# Patient Record
Sex: Male | Born: 1945 | Race: White | Hispanic: No | Marital: Married | Smoking: Former smoker
Health system: Southern US, Community
[De-identification: ages and names within clinical notes are randomized; demographics above are authoritative.]

## PROBLEM LIST (undated history)

## (undated) DIAGNOSIS — E213 Hyperparathyroidism, unspecified: Secondary | ICD-10-CM

## (undated) DIAGNOSIS — E7800 Pure hypercholesterolemia, unspecified: Secondary | ICD-10-CM

## (undated) DIAGNOSIS — F329 Major depressive disorder, single episode, unspecified: Secondary | ICD-10-CM

## (undated) DIAGNOSIS — F32A Depression, unspecified: Secondary | ICD-10-CM

## (undated) DIAGNOSIS — N400 Enlarged prostate without lower urinary tract symptoms: Secondary | ICD-10-CM

## (undated) DIAGNOSIS — I10 Essential (primary) hypertension: Secondary | ICD-10-CM

## (undated) DIAGNOSIS — E119 Type 2 diabetes mellitus without complications: Secondary | ICD-10-CM

## (undated) DIAGNOSIS — Z87898 Personal history of other specified conditions: Secondary | ICD-10-CM

## (undated) DIAGNOSIS — L570 Actinic keratosis: Secondary | ICD-10-CM

## (undated) DIAGNOSIS — R3911 Hesitancy of micturition: Secondary | ICD-10-CM

## (undated) DIAGNOSIS — F419 Anxiety disorder, unspecified: Secondary | ICD-10-CM

## (undated) DIAGNOSIS — D1730 Benign lipomatous neoplasm of skin and subcutaneous tissue of unspecified sites: Secondary | ICD-10-CM

## (undated) DIAGNOSIS — R351 Nocturia: Secondary | ICD-10-CM

## (undated) DIAGNOSIS — K432 Incisional hernia without obstruction or gangrene: Secondary | ICD-10-CM

## (undated) DIAGNOSIS — E039 Hypothyroidism, unspecified: Secondary | ICD-10-CM

## (undated) DIAGNOSIS — M7712 Lateral epicondylitis, left elbow: Secondary | ICD-10-CM

## (undated) DIAGNOSIS — K219 Gastro-esophageal reflux disease without esophagitis: Secondary | ICD-10-CM

## (undated) DIAGNOSIS — G039 Meningitis, unspecified: Secondary | ICD-10-CM

## (undated) DIAGNOSIS — L821 Other seborrheic keratosis: Secondary | ICD-10-CM

## (undated) DIAGNOSIS — Z973 Presence of spectacles and contact lenses: Secondary | ICD-10-CM

## (undated) HISTORY — PX: APPENDECTOMY: SHX54

## (undated) HISTORY — PX: VASECTOMY: SHX75

## (undated) HISTORY — PX: FOOT SURGERY: SHX648

---

## 2002-08-23 ENCOUNTER — Ambulatory Visit (HOSPITAL_COMMUNITY): Admission: RE | Admit: 2002-08-23 | Discharge: 2002-08-23 | Payer: Self-pay | Admitting: Gastroenterology

## 2014-09-24 ENCOUNTER — Encounter: Payer: Self-pay | Admitting: Endocrinology

## 2014-09-24 ENCOUNTER — Ambulatory Visit (INDEPENDENT_AMBULATORY_CARE_PROVIDER_SITE_OTHER): Payer: Medicare Other | Admitting: Endocrinology

## 2014-09-24 MED ORDER — FUROSEMIDE 20 MG PO TABS: 20.0000 mg | ORAL_TABLET | Freq: Every day | ORAL | Status: DC

## 2014-09-24 NOTE — Progress Notes (Signed)
Subjective:    Patient ID: Douglas Hutchinson, male    DOB: 1946/01/16, 69 y.o.   MRN: [id number]  HPI Pt was first noted to have hypercalcemia in mid-2015.  No assoc urolithiasis.  He has never had PUD, pancreatitis, depression, Li++, cancer, recent severe injury, prolonged immobilization, antacids, sarcoidosis, or hyperparathyroidism.  He had a severe fracture of the right ankle in 1999, when he fell from a ladder.  No other fracture hx.  He does not take vitamin-D or A supplements.   No past medical history on file.  No past surgical history on file.  Social History   Social History  . Marital Status: Married    Spouse Name: N/A  . Number of Children: N/A  . Years of Education: N/A   Occupational History  . Not on file.   Social History Main Topics  . Smoking status: Former Research scientist (life sciences)  . Smokeless tobacco: Not on file  . Alcohol Use: 0.0 oz/week    0 Standard drinks or equivalent per week  . Drug Use: Not on file  . Sexual Activity: Not on file   Other Topics Concern  . Not on file   Social History Narrative  . No narrative on file    No current outpatient prescriptions on file prior to visit.   No current facility-administered medications on file prior to visit.    Allergies  Allergen Reactions  . Buspirone   . Itraconazole   . Penicillins     Family History  Problem Relation Age of Onset  . Hypercalcemia Neg Hx     BP 118/87 mmHg  Pulse 68  Temp(Src) 97.4 F (36.3 C) (Oral)  Ht 5\' 10"  (1.778 m)  Wt 174 lb (78.926 kg)  BMI 24.97 kg/m2  SpO2 99%  Review of Systems denies weight loss, galactorrhea, hematuria, memory loss, erectile dysfunction, numbness, arthralgias, abdominal pain, muscle weakness, urinary frequency, hypoglycemia, skin rash, visual loss, sob, diarrhea, rhinorrhea, easy bruising, and depression    Objective:   Physical Exam VS: see vs page GEN: no distress HEAD: head: no deformity eyes: no periorbital swelling, no proptosis external  nose and ears are normal mouth: no lesion seen NECK: supple, thyroid is not enlarged CHEST WALL: no deformity.  No kyphosis. LUNGS: clear to auscultation BREASTS:  No gynecomastia CV: reg rate and rhythm, no murmur ABD: abdomen is soft, nontender.  no hepatosplenomegaly.  not distended.  no hernia MUSCULOSKELETAL: muscle bulk and strength are grossly normal.  no obvious joint swelling.  gait is normal and steady EXTEMITIES: no deformity.  no ulcer on the feet.  feet are of normal color and temp.  no edema PULSES: dorsalis pedis intact bilat.  no carotid bruit NEURO:  cn 2-12 grossly intact.   readily moves all 4's.  sensation is intact to touch on the feet SKIN:  Normal texture and temperature.  No rash or suspicious lesion is visible.   NODES:  None palpable at the neck PSYCH: alert, well-oriented.  Does not appear anxious nor depressed.  I have reviewed outside records: Pt is on HCTZ, and HTN was well-controlled: he was noted to have hypercalcemia and elevated PTH, and referred here.  outside test results are reviewed: PTH=142.4 Ca++=11.3    Assessment & Plan:  Hyperparathyroidism, probably primary, but we should exclude vit-D deficiency Hypercalcemia, prob due to hyperparathyroidism, but we should exclude HCTZ.   HTN: given this BP, he can tolerate changing HCTZ to lasix.    Patient is advised the following:  Patient Instructions  Here is a paper to get the vitamin-D checked at [facility].   i have sent a prescription to your pharmacy, to change the HCTZ to a different fluid pill.  Please come back for a follow-up appointment in 1 month.

## 2014-09-24 NOTE — Patient Instructions (Addendum)
Here is a paper to get the vitamin-D checked at [facility].   i have sent a prescription to your pharmacy, to change the HCTZ to a different fluid pill.  Please come back for a follow-up appointment in 1 month.

## 2014-09-26 ENCOUNTER — Telehealth: Payer: Self-pay | Admitting: Endocrinology

## 2014-09-26 NOTE — Telephone Encounter (Signed)
Left voicemail advising of note below. Requested call back if the pt would like to discuss.  

## 2014-09-26 NOTE — Telephone Encounter (Signed)
please call patient: Vitamin-D is normal. Please adjust meds as we discussed. i'll see you next time.

## 2014-10-27 ENCOUNTER — Ambulatory Visit (INDEPENDENT_AMBULATORY_CARE_PROVIDER_SITE_OTHER): Payer: Medicare Other | Admitting: Endocrinology

## 2014-10-27 ENCOUNTER — Encounter: Payer: Self-pay | Admitting: Endocrinology

## 2014-10-27 ENCOUNTER — Telehealth: Payer: Self-pay

## 2014-10-27 DIAGNOSIS — F32A Depression, unspecified: Secondary | ICD-10-CM

## 2014-10-27 DIAGNOSIS — E210 Primary hyperparathyroidism: Secondary | ICD-10-CM | POA: Diagnosis not present

## 2014-10-27 DIAGNOSIS — F329 Major depressive disorder, single episode, unspecified: Secondary | ICD-10-CM | POA: Diagnosis not present

## 2014-10-27 DIAGNOSIS — E039 Hypothyroidism, unspecified: Secondary | ICD-10-CM

## 2014-10-27 MED ORDER — FUROSEMIDE 20 MG PO TABS: 10.0000 mg | ORAL_TABLET | Freq: Every day | ORAL | Status: DC

## 2014-10-27 NOTE — Telephone Encounter (Signed)
done

## 2014-10-27 NOTE — Telephone Encounter (Signed)
I contacted the pt and advised his bone density scan is scheduled for 11/13/14 at [facility] on [address]. Appointment time is 9:30. Pt was provided address and phone number. Pt voiced understanding.

## 2014-10-27 NOTE — Patient Instructions (Addendum)
Please reduce the furosemide to 1/2 pill daily.  blood tests are requested for you today.  We'll let you know about the results.  Please come back for a follow-up appointment in 6 months.  Let's check the bone density test.

## 2014-10-27 NOTE — Telephone Encounter (Signed)
I contacted [facility]. They do not do bone density scans at their office.

## 2014-10-27 NOTE — Telephone Encounter (Signed)
Ok, can we put the order in? Thanks!

## 2014-10-27 NOTE — Telephone Encounter (Signed)
Ok, please request for [facility]

## 2014-10-27 NOTE — Progress Notes (Signed)
   Subjective:    Patient ID: Douglas Hutchinson, male    DOB: 11/01/1945, 69 y.o.   MRN: [id number]  HPI Pt returns for f/u of primary hyperparathyroidism (hypercalcemia was first noted in mid-2015; only fx was of the right ankle in 1999, when he fell from a ladder; HCTZ was changed to lasix last month; Vit-D was normal).  pt says he has polyuria, but no pain at the prostate.  No assoc dysuria.   No past medical history on file.  No past surgical history on file.  Social History   Social History  . Marital Status: Married    Spouse Name: N/A  . Number of Children: N/A  . Years of Education: N/A   Occupational History  . Not on file.   Social History Main Topics  . Smoking status: Former Research scientist (life sciences)  . Smokeless tobacco: Not on file  . Alcohol Use: 0.0 oz/week    0 Standard drinks or equivalent per week  . Drug Use: Not on file  . Sexual Activity: Not on file   Other Topics Concern  . Not on file   Social History Narrative    Current Outpatient Prescriptions on File Prior to Visit  Medication Sig Dispense Refill  . aspirin 325 MG tablet Take by mouth.    Marland Kitchen atorvastatin (LIPITOR) 40 MG tablet TK 1 T PO QD  1  . FLUoxetine (PROZAC) 10 MG capsule Take by mouth.    . levothyroxine (SYNTHROID, LEVOTHROID) 75 MCG tablet TK 1 T PO QD  2  . metFORMIN (GLUCOPHAGE) 1000 MG tablet TK 1 T PO BID WITH MEALS  0  . Multiple Vitamin (MULTIVITAMIN) capsule Take 1 capsule by mouth daily.     No current facility-administered medications on file prior to visit.    Allergies  Allergen Reactions  . Buspirone   . Itraconazole   . Penicillins     Family History  Problem Relation Age of Onset  . Hypercalcemia Neg Hx     BP 124/82 mmHg  Pulse 68  Temp(Src) 97.7 F (36.5 C) (Oral)  Ht 5\' 10"  (1.778 m)  Wt 175 lb (79.379 kg)  BMI 25.11 kg/m2  SpO2 97%    Review of Systems Denies edema    Objective:   Physical Exam VITAL SIGNS:  See vs page GENERAL: no distress Ext: no  edema    PTH=155 Ca++=11.2    Assessment & Plan:  HTN: well-controlled Polyuria, new, due to lasix.  Primary hyperparathyroidism, persistent.   No need for surgery now unless she has osteoporosis.     Patient is advised the following: Patient Instructions  Please reduce the furosemide to 1/2 pill daily.  blood tests are requested for you today.  We'll let you know about the results.  Please come back for a follow-up appointment in 6 months.  Let's check the bone density test.

## 2014-10-28 DIAGNOSIS — E039 Hypothyroidism, unspecified: Secondary | ICD-10-CM | POA: Insufficient documentation

## 2014-10-28 DIAGNOSIS — F329 Major depressive disorder, single episode, unspecified: Secondary | ICD-10-CM | POA: Insufficient documentation

## 2014-10-28 DIAGNOSIS — F32A Depression, unspecified: Secondary | ICD-10-CM | POA: Insufficient documentation

## 2014-10-28 LAB — PTH, INTACT AND CALCIUM
Calcium: 11.2 mg/dL — ABNORMAL HIGH (ref 8.4–10.5)
PTH: 155 pg/mL — ABNORMAL HIGH (ref 14–64)

## 2014-11-13 ENCOUNTER — Ambulatory Visit (INDEPENDENT_AMBULATORY_CARE_PROVIDER_SITE_OTHER)
Admission: RE | Admit: 2014-11-13 | Discharge: 2014-11-13 | Disposition: A | Discharge status: Home / Self Care | Payer: Medicare Other | Source: Ambulatory Visit | Attending: Endocrinology | Admitting: Endocrinology

## 2014-11-13 DIAGNOSIS — E210 Primary hyperparathyroidism: Secondary | ICD-10-CM

## 2014-11-18 ENCOUNTER — Telehealth: Payer: Self-pay | Admitting: Endocrinology

## 2014-11-18 DIAGNOSIS — E210 Primary hyperparathyroidism: Secondary | ICD-10-CM

## 2014-11-18 NOTE — Telephone Encounter (Signed)
Ok.  you will receive a phone call, about a day and time for an appointment 

## 2014-11-18 NOTE — Telephone Encounter (Signed)
Patient has decided that he does want to have a surgery consult. Please enter general surgery referral. Thank you!

## 2014-11-28 ENCOUNTER — Ambulatory Visit: Payer: Self-pay | Admitting: General Surgery

## 2014-11-28 NOTE — H&P (Signed)
History of Present Illness Douglas Spruce MD; 11/28/2014 11:13 AM) Patient words: New-hyperparathyroid.  The patient is a 69 year old male who presents with a parathyroid neoplasm. 69 year old male found to have calcium of 11.2 on screening exam by primary care physician and was referred to endocrinologist who performed DEXA scan finding osteoporosis and bilateral hips. He has no history of nephrolithiasis, no history of GI constipation or other complaints, no history of depression and anxiety, he had a ankle fracture years ago from falling off a ladder. He has no history of myocardial infarction he is on metformin for prediabetes and has taken aspirin for several years. He does not smoke.  Referred by Dr. Loanne Drilling   Other Problems Malachi Bonds, CMA; 11/28/2014 10:51 AM) Anxiety Disorder Asthma Enlarged Prostate Hypercholesterolemia Other disease, cancer, significant illness  Past Surgical History Malachi Bonds, CMA; 11/28/2014 10:51 AM) Appendectomy Colon Polyp Removal - Colonoscopy Foot Surgery Right. Vasectomy  Diagnostic Studies History Malachi Bonds, CMA; 11/28/2014 10:51 AM) Colonoscopy 1-5 years ago  Allergies Malachi Bonds, CMA; 11/28/2014 10:52 AM) BusPIRone HCl *ANTIANXIETY AGENTS* Itraconazole *ANTIFUNGALS* Penicillin G Sodium *PENICILLINS*  Medication History (Chemira Jones, CMA; 11/28/2014 10:53 AM) Atorvastatin Calcium (40MG  Tablet, Oral) Active. Furosemide (20MG  Tablet, Oral) Active. Levothyroxine Sodium (75MCG Tablet, Oral) Active. MetFORMIN HCl (1000MG  Tablet, Oral) Active. Aspirin (81MG  Tablet, Oral) Active. FLUoxetine HCl (PMDD) (10MG  Tablet, Oral) Active. Multivitamin Adult (Oral) Active. Medications Reconciled  Social History Malachi Bonds, CMA; 11/28/2014 10:51 AM) Alcohol use Occasional alcohol use. Caffeine use Carbonated beverages, Coffee, Tea. No drug use Tobacco use Former smoker.  Family History Malachi Bonds, CMA; 11/28/2014 10:51 AM) Prostate Cancer Father. Thyroid problems Mother.    Review of Systems Malachi Bonds CMA; 11/28/2014 10:51 AM) General Not Present- Appetite Loss, Chills, Fatigue, Fever, Night Sweats, Weight Gain and Weight Loss. Skin Not Present- Change in Wart/Mole, Dryness, Hives, Jaundice, New Lesions, Non-Healing Wounds, Rash and Ulcer. HEENT Present- Wears glasses/contact lenses. Not Present- Earache, Hearing Loss, Hoarseness, Nose Bleed, Oral Ulcers, Ringing in the Ears, Seasonal Allergies, Sinus Pain, Sore Throat, Visual Disturbances and Yellow Eyes. Respiratory Present- Snoring. Not Present- Bloody sputum, Chronic Cough, Difficulty Breathing and Wheezing. Cardiovascular Not Present- Chest Pain, Difficulty Breathing Lying Down, Leg Cramps, Palpitations, Rapid Heart Rate, Shortness of Breath and Swelling of Extremities. Gastrointestinal Not Present- Abdominal Pain, Bloating, Bloody Stool, Change in Bowel Habits, Chronic diarrhea, Constipation, Difficulty Swallowing, Excessive gas, Gets full quickly at meals, Hemorrhoids, Indigestion, Nausea, Rectal Pain and Vomiting. Male Genitourinary Present- Change in Urinary Stream, Frequency, Nocturia and Urgency. Not Present- Blood in Urine, Impotence, Painful Urination and Urine Leakage. Musculoskeletal Not Present- Back Pain, Joint Pain, Joint Stiffness, Muscle Pain, Muscle Weakness and Swelling of Extremities. Neurological Not Present- Decreased Memory, Fainting, Headaches, Numbness, Seizures, Tingling, Tremor, Trouble walking and Weakness. Psychiatric Present- Anxiety. Not Present- Bipolar, Change in Sleep Pattern, Depression, Fearful and Frequent crying. Endocrine Not Present- Cold Intolerance, Excessive Hunger, Hair Changes, Heat Intolerance, Hot flashes and New Diabetes. Hematology Not Present- Easy Bruising, Excessive bleeding, Gland problems, HIV and Persistent Infections.  Vitals (Chemira Jones CMA; 11/28/2014 10:51  AM) 11/28/2014 10:51 AM Weight: 175.8 lb Height: 70in Body Surface Area: 1.98 m Body Mass Index: 25.22 kg/m  Temp.: 97.30F(Oral)  Pulse: 78 (Regular)  BP: 132/84 (Sitting, Left Arm, Standard)       Physical Exam Douglas Spruce MD; 11/28/2014 11:08 AM) General Mental Status-Alert. General Appearance-Cooperative. Orientation-Oriented X4. Posture-Normal posture.  Integumentary Global Assessment Upon inspection and palpation of skin surfaces of the -  Head/Face: no rashes, ulcers, lesions or evidence of photo damage. No palpable nodules or masses and Neck: no visible lesions or palpable masses.  Head and Neck Head-normocephalic, atraumatic with no lesions or palpable masses. Face Global Assessment - atraumatic. Neck -Note: No carotid bruits, no palpable masses in the neck or supraclavicular stasis.  Thyroid Gland Characteristics - normal size and consistency.  Eye Eyeball - Bilateral-Extraocular movements intact. Sclera/Conjunctiva - Bilateral-No scleral icterus, No Discharge.  ENMT Nose and Sinuses External Inspection of the Nose - no deformities observed, no swelling present.  Chest and Lung Exam Palpation Palpation of the chest reveals - Non-tender. Auscultation Breath sounds - Normal.  Cardiovascular Auscultation Rhythm - Regular. Heart Sounds - S1 WNL and S2 WNL. Carotid arteries - No Carotid bruit.  Abdomen Inspection Inspection of the abdomen reveals - No Visible peristalsis, No Abnormal pulsations and No Paradoxical movements. Palpation/Percussion Palpation and Percussion of the abdomen reveal - Soft, Non Tender, No Rebound tenderness, No Rigidity (guarding), No hepatosplenomegaly and No Palpable abdominal masses.  Peripheral Vascular Upper Extremity Palpation - Pulses bilaterally normal. Lower Extremity Palpation - Edema - Bilateral - No edema.  Neurologic Neurologic evaluation reveals -normal sensation and  normal coordination.  Neuropsychiatric Mental status exam performed with findings of-able to articulate well with normal speech/language, rate, volume and coherence and thought content normal with ability to perform basic computations and apply abstract reasoning.  Musculoskeletal Normal Exam - Bilateral-Upper Extremity Strength Normal and Lower Extremity Strength Normal.    Assessment & Plan Douglas Spruce MD; 11/28/2014 11:12 AM) HYPERPARATHYROIDISM (E21.3) Impression: 69 year old male with hypercalcemia and hyperparathyroidism primary. We discussed the etiology of his pathology that it could be from a single adenoma or from diffuse hyper parathyroidism. We discussed the options for surgery versus imaging and surgery versus observation. We decided to proceed with localization imaging and then perform exploration of parathyroids. We discussed the risks of the operation including but not limited to recurrent laryngeal nerve injury, vagus nerve injury, airway edema, bleeding, infection, hypocalcemia, failure to achieve a regular PTH, possibility of abnormal parathyroid tissue in places such as chest or posterior neck. We discussed the reason to operate is to avoid other complications of hypercalcemia and to improve his osteoporosis. He showed good understanding and decided to proceed. -parathyroid scan -plan for neck exploration with parathyroidectomy Current Plans PARATHYROID SCAN (16109) You are being scheduled for surgery - Our schedulers will call you.  You should hear from our office's scheduling department within 5 working days about the location, date, and time of surgery. We try to make accommodations for patient's preferences in scheduling surgery, but sometimes the OR schedule or the surgeon's schedule prevents [place] from making those accommodations.  If you have not heard from our office [phone]) in 5 working days, call the office and ask for your surgeon's nurse.  If you  have other questions about your diagnosis, plan, or surgery, call the office and ask for your surgeon's nurse.

## 2014-12-05 ENCOUNTER — Other Ambulatory Visit (HOSPITAL_COMMUNITY): Payer: Self-pay | Admitting: General Surgery

## 2014-12-08 ENCOUNTER — Other Ambulatory Visit (HOSPITAL_COMMUNITY): Payer: Self-pay | Admitting: General Surgery

## 2014-12-08 DIAGNOSIS — E213 Hyperparathyroidism, unspecified: Secondary | ICD-10-CM

## 2014-12-16 ENCOUNTER — Encounter (HOSPITAL_COMMUNITY): Payer: Medicare Other

## 2014-12-19 ENCOUNTER — Encounter (HOSPITAL_COMMUNITY)
Admission: RE | Admit: 2014-12-19 | Discharge: 2014-12-19 | Disposition: A | Discharge status: Home / Self Care | Payer: Medicare Other | Source: Ambulatory Visit | Attending: General Surgery | Admitting: General Surgery

## 2014-12-19 DIAGNOSIS — E213 Hyperparathyroidism, unspecified: Secondary | ICD-10-CM | POA: Insufficient documentation

## 2014-12-19 MED ORDER — TECHNETIUM TC 99M SESTAMIBI GENERIC - CARDIOLITE
25.7000 | Freq: Once | INTRAVENOUS | Status: AC | PRN
  Administered 2014-12-19: 26 via INTRAVENOUS

## 2015-01-28 ENCOUNTER — Other Ambulatory Visit: Payer: Self-pay | Admitting: General Surgery

## 2015-01-29 ENCOUNTER — Encounter (HOSPITAL_COMMUNITY): Payer: Self-pay

## 2015-01-29 ENCOUNTER — Encounter (HOSPITAL_COMMUNITY)
Admission: RE | Admit: 2015-01-29 | Discharge: 2015-01-29 | Disposition: A | Discharge status: Home / Self Care | Payer: Medicare Other | Source: Ambulatory Visit | Attending: General Surgery | Admitting: General Surgery

## 2015-01-29 ENCOUNTER — Encounter (INDEPENDENT_AMBULATORY_CARE_PROVIDER_SITE_OTHER): Payer: Self-pay

## 2015-01-29 DIAGNOSIS — E210 Primary hyperparathyroidism: Secondary | ICD-10-CM | POA: Diagnosis not present

## 2015-01-29 DIAGNOSIS — Z01818 Encounter for other preprocedural examination: Secondary | ICD-10-CM | POA: Insufficient documentation

## 2015-01-29 HISTORY — DX: Pure hypercholesterolemia, unspecified: E78.00

## 2015-01-29 HISTORY — DX: Hyperparathyroidism, unspecified: E21.3

## 2015-01-29 HISTORY — DX: Nocturia: R35.1

## 2015-01-29 HISTORY — DX: Hypercalcemia: E83.52

## 2015-01-29 HISTORY — DX: Hesitancy of micturition: R39.11

## 2015-01-29 HISTORY — DX: Hypothyroidism, unspecified: E03.9

## 2015-01-29 HISTORY — DX: Lateral epicondylitis, left elbow: M77.12

## 2015-01-29 HISTORY — DX: Actinic keratosis: L57.0

## 2015-01-29 HISTORY — DX: Meningitis, unspecified: G03.9

## 2015-01-29 HISTORY — DX: Presence of spectacles and contact lenses: Z97.3

## 2015-01-29 HISTORY — DX: Other seborrheic keratosis: L82.1

## 2015-01-29 HISTORY — DX: Anxiety disorder, unspecified: F41.9

## 2015-01-29 HISTORY — DX: Benign prostatic hyperplasia without lower urinary tract symptoms: N40.0

## 2015-01-29 HISTORY — DX: Incisional hernia without obstruction or gangrene: K43.2

## 2015-01-29 HISTORY — DX: Benign lipomatous neoplasm of skin and subcutaneous tissue of unspecified sites: D17.30

## 2015-01-29 HISTORY — DX: Type 2 diabetes mellitus without complications: E11.9

## 2015-01-29 LAB — CBC
HEMATOCRIT: 44 % (ref 39.0–52.0)
Hemoglobin: 14.3 g/dL (ref 13.0–17.0)
MCH: 29.5 pg (ref 26.0–34.0)
MCHC: 32.5 g/dL (ref 30.0–36.0)
MCV: 90.7 fL (ref 78.0–100.0)
PLATELETS: 167 10*3/uL (ref 150–400)
RBC: 4.85 MIL/uL (ref 4.22–5.81)
RDW: 13 % (ref 11.5–15.5)
WBC: 5.6 10*3/uL (ref 4.0–10.5)

## 2015-01-29 LAB — BASIC METABOLIC PANEL
Anion gap: 7 (ref 5–15)
BUN: 18 mg/dL (ref 6–20)
CHLORIDE: 106 mmol/L (ref 101–111)
CO2: 29 mmol/L (ref 22–32)
CREATININE: 1.07 mg/dL (ref 0.61–1.24)
Calcium: 10.6 mg/dL — ABNORMAL HIGH (ref 8.9–10.3)
GFR calc Af Amer: >60 mL/min (ref >60)
GFR calc non Af Amer: >60 mL/min (ref >60)
GLUCOSE: 115 mg/dL — AB (ref 65–99)
POTASSIUM: 4.7 mmol/L (ref 3.5–5.1)
Sodium: 142 mmol/L (ref 135–145)

## 2015-01-29 NOTE — Progress Notes (Signed)
H&P per chart per Dr Tollie Pizza 09/30/2014

## 2015-01-29 NOTE — Patient Instructions (Signed)
Douglas Hutchinson  01/29/2015   Your procedure is scheduled on: Tuesday February 10, 2015  [facility]  Entrance take [place]  elevators to 3rd floor to  [place] at 9:30 AM.  Call this number if you have problems the morning of surgery [phone]   Remember: ONLY 1 PERSON MAY GO WITH YOU TO SHORT STAY TO GET  READY MORNING OF [place].  Do not eat food or drink liquids :After Midnight.     Take these medicines the morning of surgery with A SIP OF WATER: Fluoxetine (Prozac); Levothyroxine DO NOT TAKE ANY DIABETIC MEDICATIONS DAY OF YOUR SURGERY                               You may not have any metal on your body including hair pins and              piercings  Do not wear jewelry, lotions, powders or colognes, deodorant                          Men may shave face and neck.   Do not bring valuables to the hospital. [name].  Contacts, dentures or bridgework may not be worn into surgery.  Leave suitcase in the car. After surgery it may be brought to your room.   _____________________________________________________________________             [facility] - Preparing for Surgery Before surgery, you can play an important role.  Because skin is not sterile, your skin needs to be as free of germs as possible.  You can reduce the number of germs on your skin by washing with CHG (chlorahexidine gluconate) soap before surgery.  CHG is an antiseptic cleaner which kills germs and bonds with the skin to continue killing germs even after washing. Please DO NOT use if you have an allergy to CHG or antibacterial soaps.  If your skin becomes reddened/irritated stop using the CHG and inform your nurse when you arrive at Short Stay. Do not shave (including legs and underarms) for at least 48 hours prior to the first CHG shower.  You may shave your face/neck. Please follow these instructions  carefully:  1.  Shower with CHG Soap the night before surgery and the  morning of Surgery.  2.  If you choose to wash your hair, wash your hair first as usual with your  normal  shampoo.  3.  After you shampoo, rinse your hair and body thoroughly to remove the  shampoo.                           4.  Use CHG as you would any other liquid soap.  You can apply chg directly  to the skin and wash                       Gently with a scrungie or clean washcloth.  5.  Apply the CHG Soap to your body ONLY FROM THE NECK DOWN.   Do not use on face/ open  Wound or open sores. Avoid contact with eyes, ears mouth and genitals (private parts).                       Wash face,  Genitals (private parts) with your normal soap.             6.  Wash thoroughly, paying special attention to the area where your surgery  will be performed.  7.  Thoroughly rinse your body with warm water from the neck down.  8.  DO NOT shower/wash with your normal soap after using and rinsing off  the CHG Soap.                9.  Pat yourself dry with a clean towel.            10.  Wear clean pajamas.            11.  Place clean sheets on your bed the night of your first shower and do not  sleep with pets. Day of Surgery : Do not apply any lotions/deodorants the morning of surgery.  Please wear clean clothes to the hospital/surgery center.  FAILURE TO FOLLOW THESE INSTRUCTIONS MAY RESULT IN THE CANCELLATION OF YOUR SURGERY PATIENT SIGNATURE_________________________________  NURSE SIGNATURE__________________________________  ________________________________________________________________________

## 2015-01-30 LAB — HEMOGLOBIN A1C
Hgb A1c MFr Bld: 5.9 % — ABNORMAL HIGH (ref 4.8–5.6)
MEAN PLASMA GLUCOSE: 123 mg/dL

## 2015-01-30 NOTE — Progress Notes (Signed)
HGA1C results in epic per PAT visit 01/29/2015 sent to Dr Kieth Brightly

## 2015-02-03 ENCOUNTER — Ambulatory Visit: Payer: Self-pay | Admitting: General Surgery

## 2015-02-10 ENCOUNTER — Encounter (HOSPITAL_COMMUNITY): Payer: Self-pay | Admitting: Certified Registered Nurse Anesthetist

## 2015-02-10 ENCOUNTER — Ambulatory Visit (HOSPITAL_COMMUNITY)
Admission: RE | Admit: 2015-02-10 | Discharge: 2015-02-10 | Disposition: A | Discharge status: Home / Self Care | Payer: Medicare Other | Source: Ambulatory Visit | Attending: General Surgery | Admitting: General Surgery

## 2015-02-10 ENCOUNTER — Encounter (HOSPITAL_COMMUNITY)
Admission: RE | Disposition: A | Discharge status: Home / Self Care | Payer: Self-pay | Source: Ambulatory Visit | Attending: General Surgery

## 2015-02-10 ENCOUNTER — Encounter (HOSPITAL_COMMUNITY): Payer: Self-pay | Admitting: *Deleted

## 2015-02-10 DIAGNOSIS — E119 Type 2 diabetes mellitus without complications: Secondary | ICD-10-CM | POA: Diagnosis not present

## 2015-02-10 DIAGNOSIS — Z79899 Other long term (current) drug therapy: Secondary | ICD-10-CM | POA: Diagnosis not present

## 2015-02-10 DIAGNOSIS — Z7982 Long term (current) use of aspirin: Secondary | ICD-10-CM | POA: Diagnosis not present

## 2015-02-10 DIAGNOSIS — Z539 Procedure and treatment not carried out, unspecified reason: Secondary | ICD-10-CM | POA: Diagnosis not present

## 2015-02-10 DIAGNOSIS — K219 Gastro-esophageal reflux disease without esophagitis: Secondary | ICD-10-CM | POA: Insufficient documentation

## 2015-02-10 DIAGNOSIS — N400 Enlarged prostate without lower urinary tract symptoms: Secondary | ICD-10-CM | POA: Diagnosis not present

## 2015-02-10 DIAGNOSIS — E039 Hypothyroidism, unspecified: Secondary | ICD-10-CM | POA: Insufficient documentation

## 2015-02-10 DIAGNOSIS — Z87891 Personal history of nicotine dependence: Secondary | ICD-10-CM | POA: Insufficient documentation

## 2015-02-10 DIAGNOSIS — E210 Primary hyperparathyroidism: Secondary | ICD-10-CM | POA: Diagnosis not present

## 2015-02-10 DIAGNOSIS — E213 Hyperparathyroidism, unspecified: Secondary | ICD-10-CM | POA: Diagnosis present

## 2015-02-10 DIAGNOSIS — E7800 Pure hypercholesterolemia, unspecified: Secondary | ICD-10-CM | POA: Diagnosis not present

## 2015-02-10 DIAGNOSIS — Z7984 Long term (current) use of oral hypoglycemic drugs: Secondary | ICD-10-CM | POA: Diagnosis not present

## 2015-02-10 HISTORY — DX: Gastro-esophageal reflux disease without esophagitis: K21.9

## 2015-02-10 LAB — BASIC METABOLIC PANEL
ANION GAP: 5 (ref 5–15)
BUN: 19 mg/dL (ref 6–20)
CALCIUM: 11.1 mg/dL — AB (ref 8.9–10.3)
CO2: 27 mmol/L (ref 22–32)
Chloride: 110 mmol/L (ref 101–111)
Creatinine, Ser: 1.13 mg/dL (ref 0.61–1.24)
Glucose, Bld: 123 mg/dL — ABNORMAL HIGH (ref 65–99)
Potassium: 5.3 mmol/L — ABNORMAL HIGH (ref 3.5–5.1)
Sodium: 142 mmol/L (ref 135–145)

## 2015-02-10 LAB — GLUCOSE, CAPILLARY: GLUCOSE-CAPILLARY: 106 mg/dL — AB (ref 65–99)

## 2015-02-10 SURGERY — PARATHYROIDECTOMY
Anesthesia: General

## 2015-02-10 MED ORDER — LIDOCAINE HCL (CARDIAC) 20 MG/ML IV SOLN
INTRAVENOUS | Status: AC
  Filled 2015-02-10: qty 5

## 2015-02-10 MED ORDER — CHLORHEXIDINE GLUCONATE 4 % EX LIQD: 1.0000 "application " | Freq: Once | CUTANEOUS | Status: DC

## 2015-02-10 MED ORDER — CIPROFLOXACIN IN D5W 400 MG/200ML IV SOLN: 400.0000 mg | INTRAVENOUS | Status: DC

## 2015-02-10 MED ORDER — PROPOFOL 10 MG/ML IV BOLUS
INTRAVENOUS | Status: AC
  Filled 2015-02-10: qty 20

## 2015-02-10 MED ORDER — DEXAMETHASONE SODIUM PHOSPHATE 10 MG/ML IJ SOLN
INTRAMUSCULAR | Status: AC
  Filled 2015-02-10: qty 1

## 2015-02-10 MED ORDER — FENTANYL CITRATE (PF) 250 MCG/5ML IJ SOLN
INTRAMUSCULAR | Status: AC
  Filled 2015-02-10: qty 5

## 2015-02-10 MED ORDER — MIDAZOLAM HCL 2 MG/2ML IJ SOLN
INTRAMUSCULAR | Status: AC
  Filled 2015-02-10: qty 2

## 2015-02-10 MED ORDER — CLINDAMYCIN PHOSPHATE 900 MG/50ML IV SOLN
900.0000 mg | Freq: Once | INTRAVENOUS | Status: DC
Start: 2015-02-10 — End: 2015-02-10

## 2015-02-10 MED ORDER — ONDANSETRON HCL 4 MG/2ML IJ SOLN
INTRAMUSCULAR | Status: AC
  Filled 2015-02-10: qty 2

## 2015-02-10 NOTE — Progress Notes (Signed)
Pt discharged from short stay pre-op per MD verbal order. Case cancel at 1050. Unable to enter order via late entry) .

## 2015-02-10 NOTE — H&P (Signed)
Douglas Hutchinson is an 70 y.o. male.   Chief Complaint: hyperparathyroidism HPI: 70 yo male with hypercalcemia found to have hyperparathyroidism with sestamibi scan positive in the left lower neck. He has no complaints currently.  Past Medical History  Diagnosis Date  . Hypothyroidism   . Diabetes mellitus without complication ([place])   . Anxiety   . Nocturia   . BPH (benign prostatic hyperplasia)   . Wears glasses   . Meningitis     history of in 1998 or 1999  . Urinary hesitancy   . Actinic keratoses   . Benign essential hypertension   . Hypercalcemia   . Hyperparathyroidism ([place])   . Seborrheic keratosis   . Pure hypercholesterolemia   . Lipoma of skin and subcutaneous tissue   . Left lateral epicondylitis   . Incisional hernia   . GERD (gastroesophageal reflux disease)     Past Surgical History  Procedure Laterality Date  . Appendectomy    . Foot surgery      secondary to trauma-falling from ladder / right foot   . Vasectomy      1980    Family History  Problem Relation Age of Onset  . Hypercalcemia Neg Hx    Social History:  reports that he quit smoking about 57 years ago. His smoking use included Cigarettes. He has a .75 pack-year smoking history. He quit smokeless tobacco use about 47 years ago. His smokeless tobacco use included Chew. He reports that he drinks alcohol. He reports that he does not use illicit drugs.  Allergies:  Allergies  Allergen Reactions  . Buspirone Other (See Comments)    CAUSED "TERRIBLE FEELING"  . Penicillins Hives     Has patient had a PCN reaction causing immediate rash, facial/tongue/throat swelling, SOB or lightheadedness with hypotension: can't remember Has patient had a PCN reaction causing severe rash involving mucus membranes or skin necrosis: no Has patient had a PCN reaction that required hospitalization: no Has patient had a PCN reaction occurring within the last 10 years: no If all of the above answers are "NO", then may  proceed with Cephalosporin use.   . Itraconazole Itching and Rash    Medications Prior to Admission  Medication Sig Dispense Refill  . aspirin 325 MG tablet Take 162.5 mg by mouth daily.    Marland Kitchen atorvastatin (LIPITOR) 40 MG tablet TAKE 20 MG (HALF A TABLET) DAILY AT BEDTIME  1  . Cholecalciferol (VITAMIN D) 2000 units tablet Take 2,000 Units by mouth daily.    . Coenzyme Q10 (COQ10 PO) Take 1 tablet by mouth daily.    Marland Kitchen FLUoxetine (PROZAC) 10 MG capsule Take 10 mg by mouth daily.     . furosemide (LASIX) 20 MG tablet Take 0.5 tablets (10 mg total) by mouth daily. 45 tablet 3  . levothyroxine (SYNTHROID, LEVOTHROID) 75 MCG tablet take 1 tablet daily  2  . metFORMIN (GLUCOPHAGE) 1000 MG tablet TAKE 1 TABLET PO BID WITH MEALS  0  . Multiple Vitamin (MULTIVITAMIN) capsule Take 1 capsule by mouth daily.      No results found for this or any previous visit (from the past 48 hour(s)). No results found.  Review of Systems  Constitutional: Negative for fever and chills.  HENT: Negative for hearing loss.   Eyes: Negative for blurred vision and double vision.  Respiratory: Negative for cough and hemoptysis.   Cardiovascular: Negative for chest pain and palpitations.  Gastrointestinal: Negative for nausea, vomiting and abdominal pain.  Genitourinary: Negative  for dysuria and urgency.  Musculoskeletal: Negative for myalgias and neck pain.  Skin: Negative for itching and rash.  Neurological: Negative for dizziness, tingling and headaches.  Endo/Heme/Allergies: Does not bruise/bleed easily.  Psychiatric/Behavioral: Negative for depression and suicidal ideas.    Blood pressure 140/84, pulse 61, temperature 97.4 F (36.3 C), temperature source Oral, resp. rate 18, height 5\' 10"  (1.778 m), weight 81.647 kg (180 lb), SpO2 100 %. Physical Exam  Vitals reviewed. Constitutional: He is oriented to person, place, and time. He appears well-developed and well-nourished.  HENT:  Head: Normocephalic and  atraumatic.  Eyes: Conjunctivae and EOM are normal. Pupils are equal, round, and reactive to light.  Neck: Normal range of motion. Neck supple.  Cardiovascular: Normal rate and regular rhythm.   Respiratory: Effort normal and breath sounds normal.  GI: Soft. Bowel sounds are normal. He exhibits no distension. There is no tenderness.  Musculoskeletal: Normal range of motion.  Neurological: He is alert and oriented to person, place, and time.  Skin: Skin is warm and dry.  Psychiatric: He has a normal mood and affect. His behavior is normal.     Assessment/Plan Primary hyperparathyroidism -parathyroidectomy  Arta Bruce Jaylanie Boschee 02/10/2015, 10:25 AM

## 2015-03-05 ENCOUNTER — Ambulatory Visit: Payer: Self-pay | Admitting: General Surgery

## 2015-03-06 ENCOUNTER — Encounter (HOSPITAL_COMMUNITY)
Admission: RE | Admit: 2015-03-06 | Discharge: 2015-03-06 | Disposition: A | Discharge status: Home / Self Care | Payer: 59 | Source: Ambulatory Visit | Attending: General Surgery | Admitting: General Surgery

## 2015-03-06 ENCOUNTER — Encounter (HOSPITAL_COMMUNITY): Payer: Self-pay

## 2015-03-06 DIAGNOSIS — Z01812 Encounter for preprocedural laboratory examination: Secondary | ICD-10-CM | POA: Diagnosis present

## 2015-03-06 DIAGNOSIS — E210 Primary hyperparathyroidism: Secondary | ICD-10-CM | POA: Insufficient documentation

## 2015-03-06 HISTORY — DX: Essential (primary) hypertension: I10

## 2015-03-06 HISTORY — DX: Major depressive disorder, single episode, unspecified: F32.9

## 2015-03-06 HISTORY — DX: Depression, unspecified: F32.A

## 2015-03-06 HISTORY — DX: Personal history of other specified conditions: Z87.898

## 2015-03-06 LAB — CBC WITH DIFFERENTIAL/PLATELET
BASOS ABS: 0 10*3/uL (ref 0.0–0.1)
BASOS PCT: 1 %
EOS ABS: 0.1 10*3/uL (ref 0.0–0.7)
EOS PCT: 1 %
HEMATOCRIT: 43.9 % (ref 39.0–52.0)
HEMOGLOBIN: 14.9 g/dL (ref 13.0–17.0)
Lymphocytes Relative: 33 %
Lymphs Abs: 1.9 10*3/uL (ref 0.7–4.0)
MCH: 30.2 pg (ref 26.0–34.0)
MCHC: 33.9 g/dL (ref 30.0–36.0)
MCV: 88.9 fL (ref 78.0–100.0)
MONO ABS: 0.4 10*3/uL (ref 0.1–1.0)
Monocytes Relative: 7 %
NEUTROS ABS: 3.3 10*3/uL (ref 1.7–7.7)
Neutrophils Relative %: 58 %
Platelets: 180 10*3/uL (ref 150–400)
RBC: 4.94 MIL/uL (ref 4.22–5.81)
RDW: 12.8 % (ref 11.5–15.5)
WBC: 5.6 10*3/uL (ref 4.0–10.5)

## 2015-03-06 LAB — BASIC METABOLIC PANEL
ANION GAP: 7 (ref 5–15)
BUN: 19 mg/dL (ref 6–20)
CALCIUM: 11.2 mg/dL — AB (ref 8.9–10.3)
CO2: 25 mmol/L (ref 22–32)
Chloride: 108 mmol/L (ref 101–111)
Creatinine, Ser: 1.14 mg/dL (ref 0.61–1.24)
GLUCOSE: 111 mg/dL — AB (ref 65–99)
POTASSIUM: 5.6 mmol/L — AB (ref 3.5–5.1)
SODIUM: 140 mmol/L (ref 135–145)

## 2015-03-06 NOTE — Patient Instructions (Signed)
ELZIE YAHNKE  03/06/2015   Your procedure is scheduled on: Wednesday March 11, 2015  Report to [facility] Main  Entrance take [place]  elevators to 3rd floor to  [place] at 6:30 AM.  Call this number if you have problems the morning of surgery [phone]   Remember: ONLY 1 PERSON MAY GO WITH YOU TO SHORT STAY TO GET  READY MORNING OF [place].  Do not eat food or drink liquids :After Midnight.     Take these medicines the morning of surgery with A SIP OF WATER: Fluoxetine (Prozac); Levothyroxine  DO NOT TAKE ANY DIABETIC MEDICATIONS DAY OF YOUR SURGERY                               You may not have any metal on your body including hair pins and              piercings  Do not wear jewelry,  lotions, powders or colognes, deodorant                       Men may shave face and neck.   Do not bring valuables to the hospital. Start.  Contacts, dentures or bridgework may not be worn into surgery.  Leave suitcase in the car. After surgery it may be brought to your room.   _____________________________________________________________________             [facility] - Preparing for Surgery Before surgery, you can play an important role.  Because skin is not sterile, your skin needs to be as free of germs as possible.  You can reduce the number of germs on your skin by washing with CHG (chlorahexidine gluconate) soap before surgery.  CHG is an antiseptic cleaner which kills germs and bonds with the skin to continue killing germs even after washing. Please DO NOT use if you have an allergy to CHG or antibacterial soaps.  If your skin becomes reddened/irritated stop using the CHG and inform your nurse when you arrive at Short Stay. Do not shave (including legs and underarms) for at least 48 hours prior to the first CHG shower.  You may shave your face/neck. Please follow these instructions carefully:  1.   Shower with CHG Soap the night before surgery and the  morning of Surgery.  2.  If you choose to wash your hair, wash your hair first as usual with your  normal  shampoo.  3.  After you shampoo, rinse your hair and body thoroughly to remove the  shampoo.                           4.  Use CHG as you would any other liquid soap.  You can apply chg directly  to the skin and wash                       Gently with a scrungie or clean washcloth.  5.  Apply the CHG Soap to your body ONLY FROM THE NECK DOWN.   Do not use on face/ open  Wound or open sores. Avoid contact with eyes, ears mouth and genitals (private parts).                       Wash face,  Genitals (private parts) with your normal soap.             6.  Wash thoroughly, paying special attention to the area where your surgery  will be performed.  7.  Thoroughly rinse your body with warm water from the neck down.  8.  DO NOT shower/wash with your normal soap after using and rinsing off  the CHG Soap.                9.  Pat yourself dry with a clean towel.            10.  Wear clean pajamas.            11.  Place clean sheets on your bed the night of your first shower and do not  sleep with pets. Day of Surgery : Do not apply any lotions/deodorants the morning of surgery.  Please wear clean clothes to the hospital/surgery center.  FAILURE TO FOLLOW THESE INSTRUCTIONS MAY RESULT IN THE CANCELLATION OF YOUR SURGERY PATIENT SIGNATURE_________________________________  NURSE SIGNATURE__________________________________  ________________________________________________________________________

## 2015-03-06 NOTE — Progress Notes (Signed)
BMP results in epic per PAT visit 03/06/2015 sent to Dr Kieth Brightly

## 2015-03-06 NOTE — Progress Notes (Addendum)
EKG/epic 01/29/2015 A1C results per epic 01/29/2015  5.9 H&P per Dr Tollie Pizza per chart 09/30/2014 (primary MD)

## 2015-03-11 ENCOUNTER — Observation Stay (HOSPITAL_COMMUNITY)
Admission: RE | Admit: 2015-03-11 | Discharge: 2015-03-12 | Disposition: A | Discharge status: Home / Self Care | Payer: Medicare Other | Source: Ambulatory Visit | Attending: General Surgery | Admitting: General Surgery

## 2015-03-11 ENCOUNTER — Ambulatory Visit (HOSPITAL_COMMUNITY): Payer: Medicare Other | Admitting: Anesthesiology

## 2015-03-11 ENCOUNTER — Encounter (HOSPITAL_COMMUNITY): Payer: Self-pay

## 2015-03-11 ENCOUNTER — Encounter (HOSPITAL_COMMUNITY)
Admission: RE | Disposition: A | Discharge status: Home / Self Care | Payer: Self-pay | Source: Ambulatory Visit | Attending: General Surgery

## 2015-03-11 DIAGNOSIS — E039 Hypothyroidism, unspecified: Secondary | ICD-10-CM | POA: Insufficient documentation

## 2015-03-11 DIAGNOSIS — E210 Primary hyperparathyroidism: Secondary | ICD-10-CM | POA: Insufficient documentation

## 2015-03-11 DIAGNOSIS — Z7982 Long term (current) use of aspirin: Secondary | ICD-10-CM | POA: Diagnosis not present

## 2015-03-11 DIAGNOSIS — E213 Hyperparathyroidism, unspecified: Secondary | ICD-10-CM | POA: Diagnosis present

## 2015-03-11 DIAGNOSIS — Z9049 Acquired absence of other specified parts of digestive tract: Secondary | ICD-10-CM | POA: Diagnosis not present

## 2015-03-11 DIAGNOSIS — Z7984 Long term (current) use of oral hypoglycemic drugs: Secondary | ICD-10-CM | POA: Insufficient documentation

## 2015-03-11 DIAGNOSIS — E7800 Pure hypercholesterolemia, unspecified: Secondary | ICD-10-CM | POA: Diagnosis not present

## 2015-03-11 DIAGNOSIS — D351 Benign neoplasm of parathyroid gland: Secondary | ICD-10-CM | POA: Diagnosis present

## 2015-03-11 DIAGNOSIS — Z79899 Other long term (current) drug therapy: Secondary | ICD-10-CM | POA: Diagnosis not present

## 2015-03-11 DIAGNOSIS — E119 Type 2 diabetes mellitus without complications: Secondary | ICD-10-CM | POA: Insufficient documentation

## 2015-03-11 DIAGNOSIS — Z87891 Personal history of nicotine dependence: Secondary | ICD-10-CM | POA: Insufficient documentation

## 2015-03-11 DIAGNOSIS — F419 Anxiety disorder, unspecified: Secondary | ICD-10-CM | POA: Insufficient documentation

## 2015-03-11 DIAGNOSIS — K219 Gastro-esophageal reflux disease without esophagitis: Secondary | ICD-10-CM | POA: Insufficient documentation

## 2015-03-11 DIAGNOSIS — I10 Essential (primary) hypertension: Secondary | ICD-10-CM | POA: Insufficient documentation

## 2015-03-11 DIAGNOSIS — F329 Major depressive disorder, single episode, unspecified: Secondary | ICD-10-CM | POA: Diagnosis not present

## 2015-03-11 DIAGNOSIS — N400 Enlarged prostate without lower urinary tract symptoms: Secondary | ICD-10-CM | POA: Diagnosis not present

## 2015-03-11 HISTORY — PX: PARATHYROIDECTOMY: SHX19

## 2015-03-11 LAB — GLUCOSE, CAPILLARY
GLUCOSE-CAPILLARY: 131 mg/dL — AB (ref 65–99)
Glucose-Capillary: 88 mg/dL (ref 65–99)

## 2015-03-11 SURGERY — PARATHYROIDECTOMY
Anesthesia: General

## 2015-03-11 MED ORDER — EPHEDRINE SULFATE 50 MG/ML IJ SOLN
INTRAMUSCULAR | Status: AC
  Filled 2015-03-11: qty 1

## 2015-03-11 MED ORDER — DEXAMETHASONE SODIUM PHOSPHATE 10 MG/ML IJ SOLN
INTRAMUSCULAR | Status: DC | PRN
  Administered 2015-03-11: 10 mg via INTRAVENOUS

## 2015-03-11 MED ORDER — PROPOFOL 10 MG/ML IV BOLUS
INTRAVENOUS | Status: AC
  Filled 2015-03-11: qty 20

## 2015-03-11 MED ORDER — SODIUM CHLORIDE 0.9 % IV SOLN
INTRAVENOUS | Status: DC
  Administered 2015-03-11: 12:00:00 via INTRAVENOUS

## 2015-03-11 MED ORDER — SODIUM CHLORIDE 0.9 % IJ SOLN
INTRAMUSCULAR | Status: AC
  Filled 2015-03-11: qty 10

## 2015-03-11 MED ORDER — FENTANYL CITRATE (PF) 100 MCG/2ML IJ SOLN
INTRAMUSCULAR | Status: DC | PRN
  Administered 2015-03-11 (×3): 50 ug via INTRAVENOUS
  Administered 2015-03-11: 100 ug via INTRAVENOUS

## 2015-03-11 MED ORDER — ONDANSETRON HCL 4 MG/2ML IJ SOLN
INTRAMUSCULAR | Status: DC | PRN
  Administered 2015-03-11: 4 mg via INTRAVENOUS

## 2015-03-11 MED ORDER — MEPERIDINE HCL 50 MG/ML IJ SOLN: 6.2500 mg | INTRAMUSCULAR | Status: DC | PRN

## 2015-03-11 MED ORDER — LIDOCAINE HCL (CARDIAC) 20 MG/ML IV SOLN
INTRAVENOUS | Status: AC
  Filled 2015-03-11: qty 5

## 2015-03-11 MED ORDER — 0.9 % SODIUM CHLORIDE (POUR BTL) OPTIME
TOPICAL | Status: DC | PRN
Start: 2015-03-11 — End: 2015-03-11
  Administered 2015-03-11: 1000 mL

## 2015-03-11 MED ORDER — ONDANSETRON 4 MG PO TBDP
4.0000 mg | ORAL_TABLET | Freq: Four times a day (QID) | ORAL | Status: DC | PRN
  Filled 2015-03-11: qty 1

## 2015-03-11 MED ORDER — LACTATED RINGERS IV SOLN
INTRAVENOUS | Status: DC | PRN
  Administered 2015-03-11: 08:00:00 via INTRAVENOUS

## 2015-03-11 MED ORDER — ONDANSETRON HCL 4 MG/2ML IJ SOLN
INTRAMUSCULAR | Status: AC
  Filled 2015-03-11: qty 2

## 2015-03-11 MED ORDER — HYDROCODONE-ACETAMINOPHEN 5-325 MG PO TABS: 1.0000 | ORAL_TABLET | ORAL | Status: DC | PRN

## 2015-03-11 MED ORDER — PROMETHAZINE HCL 25 MG/ML IJ SOLN: 6.2500 mg | INTRAMUSCULAR | Status: DC | PRN

## 2015-03-11 MED ORDER — FENTANYL CITRATE (PF) 250 MCG/5ML IJ SOLN
INTRAMUSCULAR | Status: AC
  Filled 2015-03-11: qty 5

## 2015-03-11 MED ORDER — BUPIVACAINE HCL 0.25 % IJ SOLN
INTRAMUSCULAR | Status: DC | PRN
  Administered 2015-03-11: 20 mL

## 2015-03-11 MED ORDER — HEPARIN SODIUM (PORCINE) 5000 UNIT/ML IJ SOLN
5000.0000 [IU] | Freq: Once | INTRAMUSCULAR | Status: AC
  Administered 2015-03-11: 5000 [IU] via SUBCUTANEOUS
  Filled 2015-03-11: qty 1

## 2015-03-11 MED ORDER — ONDANSETRON HCL 4 MG/2ML IJ SOLN: 4.0000 mg | Freq: Four times a day (QID) | INTRAMUSCULAR | Status: DC | PRN

## 2015-03-11 MED ORDER — CIPROFLOXACIN IN D5W 400 MG/200ML IV SOLN
INTRAVENOUS | Status: AC
  Filled 2015-03-11: qty 200

## 2015-03-11 MED ORDER — LIDOCAINE HCL (CARDIAC) 20 MG/ML IV SOLN
INTRAVENOUS | Status: DC | PRN
  Administered 2015-03-11: 100 mg via INTRAVENOUS

## 2015-03-11 MED ORDER — CIPROFLOXACIN IN D5W 400 MG/200ML IV SOLN
400.0000 mg | INTRAVENOUS | Status: AC
  Administered 2015-03-11: 400 mg via INTRAVENOUS

## 2015-03-11 MED ORDER — BUPIVACAINE-EPINEPHRINE (PF) 0.25% -1:200000 IJ SOLN
INTRAMUSCULAR | Status: AC
  Filled 2015-03-11: qty 30

## 2015-03-11 MED ORDER — DEXAMETHASONE SODIUM PHOSPHATE 10 MG/ML IJ SOLN
INTRAMUSCULAR | Status: AC
  Filled 2015-03-11: qty 1

## 2015-03-11 MED ORDER — FENTANYL CITRATE (PF) 100 MCG/2ML IJ SOLN
INTRAMUSCULAR | Status: AC
  Filled 2015-03-11: qty 2

## 2015-03-11 MED ORDER — SUCCINYLCHOLINE CHLORIDE 20 MG/ML IJ SOLN
INTRAMUSCULAR | Status: DC | PRN
  Administered 2015-03-11: 100 mg via INTRAVENOUS

## 2015-03-11 MED ORDER — FENTANYL CITRATE (PF) 100 MCG/2ML IJ SOLN
25.0000 ug | INTRAMUSCULAR | Status: DC | PRN
Start: 2015-03-11 — End: 2015-03-11
  Administered 2015-03-11: 50 ug via INTRAVENOUS

## 2015-03-11 MED ORDER — PROPOFOL 10 MG/ML IV BOLUS
INTRAVENOUS | Status: DC | PRN
  Administered 2015-03-11: 200 mg via INTRAVENOUS

## 2015-03-11 MED ORDER — IBUPROFEN 200 MG PO TABS
600.0000 mg | ORAL_TABLET | Freq: Four times a day (QID) | ORAL | Status: DC | PRN
  Filled 2015-03-11: qty 1

## 2015-03-11 MED ORDER — ROCURONIUM BROMIDE 100 MG/10ML IV SOLN
INTRAVENOUS | Status: AC
  Filled 2015-03-11: qty 1

## 2015-03-11 MED ORDER — EPHEDRINE SULFATE 50 MG/ML IJ SOLN
INTRAMUSCULAR | Status: DC | PRN
  Administered 2015-03-11 (×3): 5 mg via INTRAVENOUS

## 2015-03-11 SURGICAL SUPPLY — 39 items
ATTRACTOMAT 16X20 MAGNETIC DRP (DRAPES) ×3 IMPLANT
BENZOIN TINCTURE PRP APPL 2/3 (GAUZE/BANDAGES/DRESSINGS) IMPLANT
BLADE HEX COATED 2.75 (ELECTRODE) ×3 IMPLANT
BLADE SURG 15 STRL LF DISP TIS (BLADE) ×1 IMPLANT
BLADE SURG 15 STRL SS (BLADE) ×2
CHLORAPREP W/TINT 26ML (MISCELLANEOUS) ×3 IMPLANT
CLIP TI MEDIUM 6 (CLIP) ×6 IMPLANT
CLIP TI WIDE RED SMALL 6 (CLIP) ×6 IMPLANT
CLOSURE WOUND 1/2 X4 (GAUZE/BANDAGES/DRESSINGS)
COVER SURGICAL LIGHT HANDLE (MISCELLANEOUS) ×3 IMPLANT
DISSECTOR ROUND CHERRY 3/8 STR (MISCELLANEOUS) ×3 IMPLANT
DRAPE LAPAROTOMY T 98X78 PEDS (DRAPES) ×3 IMPLANT
DRESSING SURGICEL FIBRLLR 1X2 (HEMOSTASIS) ×1 IMPLANT
DRSG SURGICEL FIBRILLAR 1X2 (HEMOSTASIS) ×3
ELECT PENCIL ROCKER SW 15FT (MISCELLANEOUS) ×3 IMPLANT
ELECT REM PT RETURN 9FT ADLT (ELECTROSURGICAL) ×3
ELECTRODE REM PT RTRN 9FT ADLT (ELECTROSURGICAL) ×1 IMPLANT
GAUZE SPONGE 4X4 16PLY XRAY LF (GAUZE/BANDAGES/DRESSINGS) ×3 IMPLANT
GLOVE SURG ORTHO 8.0 STRL STRW (GLOVE) ×3 IMPLANT
GLOVE SURG SIGNA 7.5 PF LTX (GLOVE) ×3 IMPLANT
GLOVE SURG SS PI 7.0 STRL IVOR (GLOVE) ×6 IMPLANT
GOWN STRL REUS W/TWL XL LVL3 (GOWN DISPOSABLE) ×9 IMPLANT
KIT BASIN OR (CUSTOM PROCEDURE TRAY) ×3 IMPLANT
LIQUID BAND (GAUZE/BANDAGES/DRESSINGS) ×3 IMPLANT
NEEDLE HYPO 25X1 1.5 SAFETY (NEEDLE) ×3 IMPLANT
PACK BASIC VI WITH GOWN DISP (CUSTOM PROCEDURE TRAY) ×3 IMPLANT
STAPLER VISISTAT 35W (STAPLE) ×3 IMPLANT
STRIP CLOSURE SKIN 1/2X4 (GAUZE/BANDAGES/DRESSINGS) IMPLANT
SUT MNCRL AB 4-0 PS2 18 (SUTURE) ×3 IMPLANT
SUT SILK 2 0 (SUTURE)
SUT SILK 2-0 18XBRD TIE 12 (SUTURE) IMPLANT
SUT SILK 3 0 (SUTURE)
SUT SILK 3-0 18XBRD TIE 12 (SUTURE) IMPLANT
SUT VIC AB 3-0 SH 18 (SUTURE) ×3 IMPLANT
SYR BULB IRRIGATION 50ML (SYRINGE) ×3 IMPLANT
SYR CONTROL 10ML LL (SYRINGE) ×3 IMPLANT
TOWEL OR 17X26 10 PK STRL BLUE (TOWEL DISPOSABLE) ×3 IMPLANT
TOWEL OR NON WOVEN STRL DISP B (DISPOSABLE) ×3 IMPLANT
YANKAUER SUCT BULB TIP 10FT TU (MISCELLANEOUS) ×3 IMPLANT

## 2015-03-11 NOTE — H&P (Signed)
Douglas Hutchinson is an 70 y.o. male.   Chief Complaint: hyperparathyroidism HPI: 70 yo male found to have osteoporosis, hypercalcemia and hyperparathyroidism. He has no history of kidney stones, abdominal pain, psych disease, or fractures. He denies fevers or chills or recent infection.  Past Medical History  Diagnosis Date  . Hypothyroidism   . Diabetes mellitus without complication ([place])   . Anxiety   . Nocturia   . BPH (benign prostatic hyperplasia)   . Wears glasses   . Meningitis     history of in 1998 or 1999  . Urinary hesitancy   . Actinic keratoses   . Hypercalcemia   . Hyperparathyroidism ([place])   . Seborrheic keratosis   . Pure hypercholesterolemia   . Lipoma of skin and subcutaneous tissue   . Left lateral epicondylitis   . Incisional hernia   . H/O urinary frequency   . GERD (gastroesophageal reflux disease)     history of   . Hypertension     benign essential hypertension per H&P Dr Tollie Pizza 09/30/2014  . Depression     Past Surgical History  Procedure Laterality Date  . Appendectomy    . Foot surgery      secondary to trauma-falling from ladder / right foot   . Vasectomy      1980    Family History  Problem Relation Age of Onset  . Hypercalcemia Neg Hx    Social History:  reports that he quit smoking about 47 years ago. His smoking use included Cigarettes, Pipe, and Cigars. He has a .75 pack-year smoking history. He quit smokeless tobacco use about 47 years ago. His smokeless tobacco use included Chew. He reports that he drinks alcohol. He reports that he does not use illicit drugs.  Allergies:  Allergies  Allergen Reactions  . Buspirone Other (See Comments)    CAUSED "TERRIBLE FEELING"  . Penicillins Hives     Has patient had a PCN reaction causing immediate rash, facial/tongue/throat swelling, SOB or lightheadedness with hypotension: can't remember Has patient had a PCN reaction causing severe rash involving mucus membranes or skin necrosis: no Has  patient had a PCN reaction that required hospitalization: no Has patient had a PCN reaction occurring within the last 10 years: no If all of the above answers are "NO", then may proceed with Cephalosporin use.   . Itraconazole Itching and Rash    Medications Prior to Admission  Medication Sig Dispense Refill  . atorvastatin (LIPITOR) 40 MG tablet TAKE 20 MG (HALF A TABLET) DAILY AT BEDTIME  1  . Cholecalciferol (VITAMIN D) 2000 units tablet Take 2,000 Units by mouth daily.    . Coenzyme Q10 (COQ10 PO) Take 1 tablet by mouth daily.    Marland Kitchen FLUoxetine (PROZAC) 10 MG capsule Take 10 mg by mouth daily.     Marland Kitchen levothyroxine (SYNTHROID, LEVOTHROID) 75 MCG tablet take 1 tablet daily  2  . metFORMIN (GLUCOPHAGE) 1000 MG tablet TAKE 1 TABLET BY MOUTH BID WITH MEALS  0  . aspirin 325 MG tablet Take 162.5 mg by mouth daily.    . furosemide (LASIX) 20 MG tablet Take 0.5 tablets (10 mg total) by mouth daily. 45 tablet 3  . Multiple Vitamin (MULTIVITAMIN) capsule Take 1 capsule by mouth daily.      Results for orders placed or performed during the hospital encounter of 03/11/15 (from the past 48 hour(s))  Glucose, capillary     Status: None   Collection Time: 03/11/15  6:12 AM  Result Value Ref Range   Glucose-Capillary 88 65 - 99 mg/dL   Comment 1 Notify RN    Comment 2 Document in Chart    No results found.  Review of Systems  Constitutional: Negative for fever and chills.  HENT: Negative for hearing loss.   Eyes: Negative for blurred vision and double vision.  Respiratory: Negative for cough and hemoptysis.   Cardiovascular: Negative for chest pain and palpitations.  Gastrointestinal: Negative for nausea, vomiting and abdominal pain.  Genitourinary: Negative for dysuria and urgency.  Musculoskeletal: Negative for myalgias and neck pain.  Skin: Negative for itching and rash.  Neurological: Negative for dizziness, tingling and headaches.  Endo/Heme/Allergies: Does not bruise/bleed easily.   Psychiatric/Behavioral: Negative for depression and suicidal ideas.    Blood pressure 134/72, pulse 65, temperature 97.4 F (36.3 C), temperature source Oral, resp. rate 16, height 5\' 10"  (1.778 m), weight 81.647 kg (180 lb), SpO2 99 %. Physical Exam  Constitutional: He is oriented to person, place, and time. He appears well-developed and well-nourished.  HENT:  Head: Normocephalic and atraumatic.  Eyes: Conjunctivae are normal. Pupils are equal, round, and reactive to light.  Neck: Normal range of motion. Neck supple.  Cardiovascular: Normal rate and regular rhythm.   Respiratory: Breath sounds normal.  GI: Soft. He exhibits no distension and no mass. There is no tenderness. There is no rebound.  Musculoskeletal: He exhibits no edema or tenderness.  Neurological: He is alert and oriented to person, place, and time.  Skin: Skin is warm and dry.  Psychiatric: He has a normal mood and affect. His behavior is normal.     Assessment/Plan Primary hyperparathyroidism, localizing to left lower gland. -parathyroidectomy  Mickeal Skinner, MD 03/11/2015, 7:59 AM

## 2015-03-11 NOTE — Op Note (Signed)
Preoperative diagnosis: primary parathyroidism  Postoperative diagnosis: primary parathyroidism with left lower adenoma   Procedure: left lower parathyroidectomy  Surgeon: Gurney Maxin, M.D.  Asst: Nedra Hai, M.D.  Anesthesia: General  Indications for procedure: Douglas Hutchinson is a 70 y.o. year old male with symptoms of osteoporosis.  On further workup he was found to have hypercalcemia and hyperparathyroidism.  After changing images which localized to the left lower parathyroid, we discussed the plan for parathyroidectomy possibility for gland went into detail over risks and benefits including recurrent laryngeal nerve injury, infection, bleeding, and possibility for reexploration.  Description of procedure: The patient was brought into the operative suite. Anesthesia was administered with General endotracheal anesthesia. WHO checklist was applied. The patient was then placed in supine. The area was prepped and draped in the usual sterile fashion.  Next a transverse incision was made 2 finger breaths above the sternal notch.  Cautery was used to dissect down the platysma and flaps are raised superiorly and inferiorly.  The strap muscle raphae was identified and incised afterwards were able to visualize the thyroid.  We began by dissecting into the lateral space directly over the thyroid and moved inferiorly. We identified the middle thyroid vein and inferior thyroid vessels.  Dissection initially saw large amount of fatty tissue which was pushed laterally.  The patient at this time showed possible mass more posteriorly.  In doing this dissection we were able to bring up a approximately 2 cm brown land into the field which was completely dissected and removed.  Once this was completed the recurrent laryngeal nerve was easily identified and confirmed with nerve monitoring. Hemostasis was applied to 3 small branches with small clips.  The area that was then packed with hemostatic agent snow.  In  the surgical site was packed with a Ray-Tec gauze.  Upon pathologic confirmation that this was indeed a parathyroid adenoma, we reexplored the area and ensured hemostasis, and then closed the strap muscles with 3-0 Vicryl in interrupted fashion.  Closed the platysma with 3-0 Vicryl in interrupted fashion.  Then closed the skin with 4-0 Monocryl subcuticular stitch.  Prior to skin closure 20 cc of quarter percent Marcaine with epinephrine was injected into the area for pain control.  Findings: left lower parathyroid adenoma  Specimen: parathyroid  Implant: none  Blood loss: 20cc  Local anesthesia: 20 ml 0.25% marcaine with epinephrine  Complications: none  Gurney Maxin, M.D. General, Bariatric, & Minimally Invasive Surgery [facility]

## 2015-03-11 NOTE — Anesthesia Preprocedure Evaluation (Addendum)
Anesthesia Evaluation  Patient identified by MRN, date of birth, ID band Patient awake    Reviewed: Allergy & Precautions, NPO status , Patient's Chart, lab work & pertinent test results  Airway Mallampati: III  TM Distance: >3 FB Neck ROM: Full    Dental no notable dental hx.    Pulmonary neg pulmonary ROS, former smoker,    Pulmonary exam normal breath sounds clear to auscultation       Cardiovascular hypertension, negative cardio ROS Normal cardiovascular exam Rhythm:Regular Rate:Normal     Neuro/Psych negative neurological ROS  negative psych ROS   GI/Hepatic negative GI ROS, Neg liver ROS,   Endo/Other  diabetes, Type 2, Oral Hypoglycemic Agents  Renal/GU negative Renal ROS  negative genitourinary   Musculoskeletal negative musculoskeletal ROS (+)   Abdominal   Peds negative pediatric ROS (+)  Hematology negative hematology ROS (+)   Anesthesia Other Findings   Reproductive/Obstetrics negative OB ROS                           Anesthesia Physical Anesthesia Plan  ASA: II  Anesthesia Plan: General   Post-op Pain Management:    Induction: Intravenous  Airway Management Planned: Oral ETT  Additional Equipment:   Intra-op Plan:   Post-operative Plan: Extubation in OR  Informed Consent: I have reviewed the patients History and Physical, chart, labs and discussed the procedure including the risks, benefits and alternatives for the proposed anesthesia with the patient or authorized representative who has indicated his/her understanding and acceptance.   Dental advisory given  Plan Discussed with: CRNA  Anesthesia Plan Comments:         Anesthesia Quick Evaluation

## 2015-03-11 NOTE — Transfer of Care (Signed)
Immediate Anesthesia Transfer of Care Note  Patient: Douglas Hutchinson  Procedure(s) Performed: Procedure(s): EXCISION LEFT INFERIOR  PARATHYROIDECTOMY (N/A)  Patient Location: PACU  Anesthesia Type:General  Level of Consciousness: awake, alert  and oriented  Airway & Oxygen Therapy: Patient Spontanous Breathing and Patient connected to face mask oxygen  Post-op Assessment: Report given to RN and Post -op Vital signs reviewed and stable  Post vital signs: Reviewed and stable  Last Vitals:  Filed Vitals:   03/11/15 0614  BP: 134/72  Pulse: 65  Temp: 36.3 C  Resp: 16    Complications: No apparent anesthesia complications

## 2015-03-11 NOTE — Anesthesia Procedure Notes (Signed)
Procedure Name: Intubation Date/Time: 03/11/2015 8:39 AM Performed by: Glory Buff Pre-anesthesia Checklist: Patient identified, Emergency Drugs available, Suction available and Patient being monitored Patient Re-evaluated:Patient Re-evaluated prior to inductionOxygen Delivery Method: Circle System Utilized Preoxygenation: Pre-oxygenation with 100% oxygen Intubation Type: IV induction Ventilation: Mask ventilation without difficulty Laryngoscope Size: Miller and 3 Grade View: Grade I Tube type: Oral Tube size (mm): 6.36mm NIM EMG ETT. Number of attempts: 1 Airway Equipment and Method: Stylet and Oral airway Placement Confirmation: ETT inserted through vocal cords under direct vision,  positive ETCO2 and breath sounds checked- equal and bilateral Secured at: 21 cm Tube secured with: Tape Dental Injury: Teeth and Oropharynx as per pre-operative assessment  Comments: A 6.63mm ID (27 FR) NIM EMG ETT ([employer]tronic) was placed under DL x 1.

## 2015-03-12 DIAGNOSIS — D351 Benign neoplasm of parathyroid gland: Secondary | ICD-10-CM | POA: Diagnosis not present

## 2015-03-12 LAB — BASIC METABOLIC PANEL
Anion gap: 8 (ref 5–15)
BUN: 19 mg/dL (ref 6–20)
CHLORIDE: 110 mmol/L (ref 101–111)
CO2: 25 mmol/L (ref 22–32)
CREATININE: 1.23 mg/dL (ref 0.61–1.24)
Calcium: 9.7 mg/dL (ref 8.9–10.3)
GFR, EST NON AFRICAN AMERICAN: 58 mL/min — AB (ref >60)
Glucose, Bld: 122 mg/dL — ABNORMAL HIGH (ref 65–99)
POTASSIUM: 5 mmol/L (ref 3.5–5.1)
SODIUM: 143 mmol/L (ref 135–145)

## 2015-03-12 MED ORDER — HYDROCODONE-ACETAMINOPHEN 5-325 MG PO TABS: 1.0000 | ORAL_TABLET | ORAL | Status: AC | PRN

## 2015-03-12 MED ORDER — CALCIUM CARBONATE-VITAMIN D 500-200 MG-UNIT PO TABS: 1.0000 | ORAL_TABLET | Freq: Every day | ORAL | Status: AC

## 2015-03-12 NOTE — Discharge Summary (Signed)
Physician Discharge Summary  Patient ID: BARNEY GESSERT MRN: [id number] DOB/AGE: [id number] y.o.  Admit date: 03/11/2015 Discharge date: 03/12/2015  Admission Diagnoses:  Discharge Diagnoses:  Active Problems:   Hyperparathyroidism [facility])   Discharged Condition: good  Hospital Course: 70 yo male with primary hyperparathyroidism underwent parathyroidectomy and was kept overnight for observation. He did well, has small amount of swallowing discomfort, no hoarseness or pain. He was discharged home POD 1  Consults: none  Significant Diagnostic Studies: labs: Ca 9.7 from 11.1  Treatments: surgery: left lower parathyroidectomy  Discharge Exam: Blood pressure 118/71, pulse 64, temperature 98.2 F (36.8 C), temperature source Oral, resp. rate 16, height 5\' 10"  (1.778 m), weight 81.647 kg (180 lb), SpO2 97 %. General appearance: alert and cooperative Head: Normocephalic, without obvious abnormality, atraumatic Neck: no adenopathy, no carotid bruit, no JVD, supple, symmetrical, trachea midline, thyroid not enlarged, symmetric, no tenderness/mass/nodules and no hematoma, wound c/d/i Resp: clear to auscultation bilaterally Cardio: regular rate and rhythm, S1, S2 normal, no murmur, click, rub or gallop  Disposition: 01-Home or Self Care     Medication List    ASK your doctor about these medications        aspirin 325 MG tablet  Take 162.5 mg by mouth daily.     atorvastatin 40 MG tablet  Commonly known as:  LIPITOR  TAKE 20 MG (HALF A TABLET) DAILY AT BEDTIME     COQ10 PO  Take 1 tablet by mouth daily.     FLUoxetine 10 MG capsule  Commonly known as:  PROZAC  Take 10 mg by mouth daily.     furosemide 20 MG tablet  Commonly known as:  LASIX  Take 0.5 tablets (10 mg total) by mouth daily.     levothyroxine 75 MCG tablet  Commonly known as:  SYNTHROID, LEVOTHROID  take 1 tablet daily     metFORMIN 1000 MG tablet  Commonly known as:  GLUCOPHAGE  TAKE 1 TABLET BY MOUTH BID  WITH MEALS     multivitamin capsule  Take 1 capsule by mouth daily.     Vitamin D 2000 units tablet  Take 2,000 Units by mouth daily.         Signed: Arta Bruce Bonniejean Piano 03/12/2015, 8:08 AM

## 2015-03-12 NOTE — Anesthesia Postprocedure Evaluation (Signed)
Anesthesia Post Note  Patient: Douglas Hutchinson  Procedure(s) Performed: Procedure(s) (LRB): EXCISION LEFT INFERIOR  PARATHYROIDECTOMY (N/A)  Patient location during evaluation: PACU Anesthesia Type: General Level of consciousness: awake and alert Pain management: pain level controlled Vital Signs Assessment: post-procedure vital signs reviewed and stable Respiratory status: spontaneous breathing, nonlabored ventilation, respiratory function stable and patient connected to nasal cannula oxygen Cardiovascular status: blood pressure returned to baseline and stable Postop Assessment: no signs of nausea or vomiting Anesthetic complications: no    Last Vitals:  Filed Vitals:   03/12/15 0151 03/12/15 0510  BP: 113/51 118/71  Pulse: 62 64  Temp: 36.7 C 36.8 C  Resp: 18 16    Last Pain:  Filed Vitals:   03/12/15 0805  PainSc: 0-No pain                 Montez Hageman

## 2015-04-27 ENCOUNTER — Ambulatory Visit: Payer: Medicare Other | Admitting: Endocrinology

## 2016-12-25 IMAGING — NM NM PARATHYROID W/ SPECT
7 series · 22 of 22 positions shown · non-contrast
Comparison: None.

CLINICAL DATA: Evaluate for parathyroid adenoma.

EXAM:
NM PARATHYROID SCINTIGRAPHY AND SPECT IMAGING
TECHNIQUE: Following intravenous administration of radiopharmaceutical, early
and 2-hour delayed planar images were obtained in the anterior
projection. Delayed triplanar SPECT images were also obtained at 2
hours.
RADIOPHARMACEUTICALS:  25.7 mCi Sc-LLm Sestamibi IV

[Series 1: 15 min ant · 4.14mm/px · 1 of 1 slices shown]
[im 1/1]
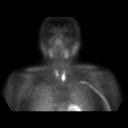

[Series 1: spect - (id)_(id)_cor · 8.3mm · 8.28mm/px · 6 of 63 frames shown]
[frame 6/63]
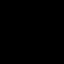
[frame 16/63]
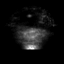
[frame 27/63]
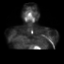
[frame 37/63]
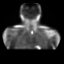
[frame 48/63]
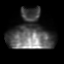
[frame 58/63]
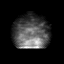

[Series 1: wbr_bone spect parathyroid - spect - (id)_tra · 8.3mm · 8.28mm/px · 6 of 64 frames shown]
[frame 6/64]
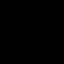
[frame 16/64]
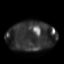
[frame 27/64]
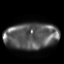
[frame 38/64]
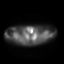
[frame 48/64]
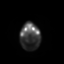
[frame 59/64]
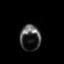

[Series 1: wbr_bone_60 15 min ant · 4.14mm/px · 1 of 1 slices shown]
[im 1/1]
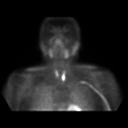

[Series 1: wbr_bone_60 2 hr ant · 4.14mm/px · 1 of 1 slices shown]
[im 1/1]
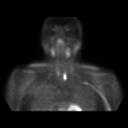

[Series 1: 2 hr ant · 4.14mm/px · 1 of 1 slices shown]
[im 1/1]
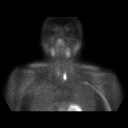

[Series 2: spect parathyroid · 8.28mm/px · 6 of 64 frames shown]
[frame 6/64]
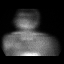
[frame 16/64]
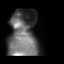
[frame 27/64]
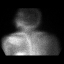
[frame 38/64]
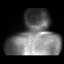
[frame 48/64]
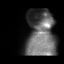
[frame 59/64]
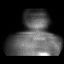

[22 of 22 positions shown; findings below may reference images not displayed]

FINDINGS: On the early images there is tracer activity in both lobes of the
thyroid gland. On the washout images there is diminished tracer
activity within the right lobe and upper pole of left lobe with a
medium size focus of intense radiotracer uptake localizing to the
area around the inferior pole of the left lobe.
IMPRESSION: 1. Parathyroid adenoma localizes to the region around the inferior
pole of the left lobe of thyroid gland.
